# Patient Record
Sex: Male | Born: 1960 | Race: Black or African American | Hispanic: No | Marital: Married | Smoking: Never smoker
Health system: Southern US, Community
[De-identification: ages and names within clinical notes are randomized; demographics above are authoritative.]

## PROBLEM LIST (undated history)

## (undated) DIAGNOSIS — I10 Essential (primary) hypertension: Secondary | ICD-10-CM

## (undated) DIAGNOSIS — I2699 Other pulmonary embolism without acute cor pulmonale: Secondary | ICD-10-CM

## (undated) HISTORY — PX: ACHILLES TENDON SURGERY: SHX542

## (undated) HISTORY — PX: GASTRIC BYPASS: SHX52

---

## 2008-12-19 ENCOUNTER — Emergency Department (HOSPITAL_BASED_OUTPATIENT_CLINIC_OR_DEPARTMENT_OTHER): Admission: EM | Admit: 2008-12-19 | Discharge: 2008-12-20 | Payer: Self-pay | Admitting: Emergency Medicine

## 2011-09-27 ENCOUNTER — Other Ambulatory Visit: Payer: Self-pay

## 2011-09-27 ENCOUNTER — Emergency Department (HOSPITAL_BASED_OUTPATIENT_CLINIC_OR_DEPARTMENT_OTHER)
Admission: EM | Admit: 2011-09-27 | Discharge: 2011-09-27 | Disposition: A | Discharge status: Home / Self Care | Payer: 59 | Source: Home / Self Care | Attending: Emergency Medicine | Admitting: Emergency Medicine

## 2011-09-27 ENCOUNTER — Inpatient Hospital Stay (HOSPITAL_COMMUNITY)
Admission: EM | Admit: 2011-09-27 | Discharge: 2011-09-30 | DRG: 638 | Disposition: A | Discharge status: Home / Self Care | Payer: 59 | Source: Other Acute Inpatient Hospital | Attending: Internal Medicine | Admitting: Internal Medicine

## 2011-09-27 DIAGNOSIS — E860 Dehydration: Secondary | ICD-10-CM

## 2011-09-27 DIAGNOSIS — I10 Essential (primary) hypertension: Secondary | ICD-10-CM | POA: Insufficient documentation

## 2011-09-27 DIAGNOSIS — E1310 Other specified diabetes mellitus with ketoacidosis without coma: Principal | ICD-10-CM | POA: Diagnosis present

## 2011-09-27 DIAGNOSIS — N179 Acute kidney failure, unspecified: Secondary | ICD-10-CM | POA: Diagnosis present

## 2011-09-27 DIAGNOSIS — N289 Disorder of kidney and ureter, unspecified: Secondary | ICD-10-CM | POA: Insufficient documentation

## 2011-09-27 DIAGNOSIS — E876 Hypokalemia: Secondary | ICD-10-CM | POA: Diagnosis present

## 2011-09-27 DIAGNOSIS — E669 Obesity, unspecified: Secondary | ICD-10-CM | POA: Insufficient documentation

## 2011-09-27 DIAGNOSIS — E1110 Type 2 diabetes mellitus with ketoacidosis without coma: Secondary | ICD-10-CM

## 2011-09-27 DIAGNOSIS — R17 Unspecified jaundice: Secondary | ICD-10-CM | POA: Diagnosis present

## 2011-09-27 HISTORY — DX: Essential (primary) hypertension: I10

## 2011-09-27 LAB — BASIC METABOLIC PANEL
BUN: 20 mg/dL (ref 6–23)
CO2: 16 mEq/L — ABNORMAL LOW (ref 19–32)
Calcium: 9.1 mg/dL (ref 8.4–10.5)
Chloride: 102 mEq/L (ref 96–112)
Creatinine, Ser: 1.4 mg/dL — ABNORMAL HIGH (ref 0.50–1.35)
GFR calc Af Amer: 68 mL/min — ABNORMAL LOW (ref >90)
GFR calc non Af Amer: 58 mL/min — ABNORMAL LOW (ref >90)
Glucose, Bld: 308 mg/dL — ABNORMAL HIGH (ref 70–99)
Potassium: 4.1 mEq/L (ref 3.5–5.1)
Sodium: 137 mEq/L (ref 135–145)

## 2011-09-27 LAB — GLUCOSE, CAPILLARY
Glucose-Capillary: 299 mg/dL — ABNORMAL HIGH (ref 70–99)
Glucose-Capillary: 256 mg/dL — ABNORMAL HIGH (ref 70–99)
Glucose-Capillary: 293 mg/dL — ABNORMAL HIGH (ref 70–99)

## 2011-09-27 LAB — COMPREHENSIVE METABOLIC PANEL
Albumin: 4 g/dL (ref 3.5–5.2)
BUN: 15 mg/dL (ref 6–23)
Calcium: 9.1 mg/dL (ref 8.4–10.5)
Creatinine, Ser: 1.2 mg/dL (ref 0.4–1.5)
Total Protein: 7.9 g/dL (ref 6.0–8.3)

## 2011-09-27 LAB — CBC
HCT: 44.8 % (ref 39.0–52.0)
HCT: 42.6 % (ref 39.0–52.0)
Hemoglobin: 14.8 g/dL (ref 13.0–17.0)
MCH: 29.9 pg (ref 26.0–34.0)
MCHC: 33 g/dL (ref 30.0–36.0)
MCHC: 34.7 g/dL (ref 30.0–36.0)
MCV: 90.4 fL (ref 78.0–100.0)
MCV: 86.1 fL (ref 78.0–100.0)
Platelets: 249 10*3/uL (ref 150–400)
Platelets: 169 10*3/uL (ref 150–400)
RBC: 4.95 MIL/uL (ref 4.22–5.81)
RDW: 13.8 % (ref 11.5–15.5)
RDW: 13.3 % (ref 11.5–15.5)
WBC: 7.4 10*3/uL (ref 4.0–10.5)

## 2011-09-27 LAB — URINALYSIS, ROUTINE W REFLEX MICROSCOPIC
Glucose, UA: NEGATIVE mg/dL
Glucose, UA: >1000 mg/dL — AB
Hgb urine dipstick: NEGATIVE
Ketones, ur: >80 mg/dL — AB
Leukocytes, UA: NEGATIVE
Leukocytes, UA: NEGATIVE
Nitrite: NEGATIVE
Nitrite: NEGATIVE
Protein, ur: 30 mg/dL — AB
Protein, ur: 30 mg/dL — AB
Specific Gravity, Urine: 1.027 (ref 1.005–1.030)
Urobilinogen, UA: 1 mg/dL (ref 0.0–1.0)
Urobilinogen, UA: 1 mg/dL (ref 0.0–1.0)
pH: 5.5 (ref 5.0–8.0)

## 2011-09-27 LAB — POCT I-STAT 3, VENOUS BLOOD GAS (G3P V)
Acid-base deficit: 10 mmol/L — ABNORMAL HIGH (ref 0.0–2.0)
Bicarbonate: 16.6 mEq/L — ABNORMAL LOW (ref 20.0–24.0)
O2 Saturation: 49 %
Patient temperature: 97.7
TCO2: 18 mmol/L (ref 0–100)
pCO2, Ven: 35.7 mmHg — ABNORMAL LOW (ref 45.0–50.0)
pH, Ven: 7.272 (ref 7.250–7.300)
pO2, Ven: 29 mmHg — CL (ref 30.0–45.0)

## 2011-09-27 LAB — DIFFERENTIAL
Lymphocytes Relative: 19 % (ref 12–46)
Monocytes Absolute: 0.6 10*3/uL (ref 0.1–1.0)
Monocytes Relative: 5 % (ref 3–12)
Neutro Abs: 9.6 10*3/uL — ABNORMAL HIGH (ref 1.7–7.7)
Neutrophils Relative %: 74 % (ref 43–77)

## 2011-09-27 LAB — URINE MICROSCOPIC-ADD ON

## 2011-09-27 LAB — MAGNESIUM: Magnesium: 2.2 mg/dL (ref 1.5–2.5)

## 2011-09-27 LAB — POCT CARDIAC MARKERS
CKMB, poc: 5.2 ng/mL (ref 1.0–8.0)
Myoglobin, poc: 179 ng/mL (ref 12–200)

## 2011-09-27 MED ORDER — DEXTROSE 50 % IV SOLN: 25.0000 mL | INTRAVENOUS | Status: DC | PRN

## 2011-09-27 MED ORDER — DEXTROSE-NACL 5-0.45 % IV SOLN: INTRAVENOUS | Status: DC

## 2011-09-27 MED ORDER — SODIUM CHLORIDE 0.9 % IV SOLN
INTRAVENOUS | Status: DC
  Administered 2011-09-27: 20:00:00 via INTRAVENOUS
  Filled 2011-09-27: qty 3

## 2011-09-27 MED ORDER — SODIUM CHLORIDE 0.9 % IV BOLUS (SEPSIS)
1000.0000 mL | Freq: Once | INTRAVENOUS | Status: AC
  Administered 2011-09-27: 1000 mL via INTRAVENOUS

## 2011-09-27 MED ORDER — SODIUM CHLORIDE 0.9 % IV SOLN: INTRAVENOUS | Status: DC

## 2011-09-27 NOTE — ED Notes (Signed)
E[place] printed and signed with copy left in chart and sent with pt

## 2011-09-27 NOTE — ED Notes (Signed)
Report called to daniellle Rn [id number] unit

## 2011-09-27 NOTE — ED Notes (Signed)
Elevated BS 324 at home-newly diagnosed DM

## 2011-09-27 NOTE — ED Notes (Signed)
Report given to [facility] 

## 2011-09-27 NOTE — ED Notes (Signed)
[facility] informed BS 298

## 2011-09-28 ENCOUNTER — Inpatient Hospital Stay (HOSPITAL_COMMUNITY): Payer: 59

## 2011-09-28 LAB — COMPREHENSIVE METABOLIC PANEL
BUN: 18 mg/dL (ref 6–23)
CO2: 18 mEq/L — ABNORMAL LOW (ref 19–32)
Calcium: 8.7 mg/dL (ref 8.4–10.5)
Chloride: 109 mEq/L (ref 96–112)
Creatinine, Ser: 1.29 mg/dL (ref 0.50–1.35)
GFR calc Af Amer: 75 mL/min — ABNORMAL LOW (ref >90)
GFR calc non Af Amer: 65 mL/min — ABNORMAL LOW (ref >90)
Total Bilirubin: 0.4 mg/dL (ref 0.3–1.2)

## 2011-09-28 LAB — GLUCOSE, CAPILLARY
Glucose-Capillary: 188 mg/dL — ABNORMAL HIGH (ref 70–99)
Glucose-Capillary: 209 mg/dL — ABNORMAL HIGH (ref 70–99)
Glucose-Capillary: 239 mg/dL — ABNORMAL HIGH (ref 70–99)
Glucose-Capillary: 286 mg/dL — ABNORMAL HIGH (ref 70–99)
Glucose-Capillary: 293 mg/dL — ABNORMAL HIGH (ref 70–99)

## 2011-09-28 LAB — BASIC METABOLIC PANEL
CO2: 20 mEq/L (ref 19–32)
Calcium: 8.7 mg/dL (ref 8.4–10.5)
Creatinine, Ser: 1.33 mg/dL (ref 0.50–1.35)
GFR calc Af Amer: 72 mL/min — ABNORMAL LOW (ref >90)
GFR calc non Af Amer: 62 mL/min — ABNORMAL LOW (ref >90)

## 2011-09-28 LAB — PRO B NATRIURETIC PEPTIDE: Pro B Natriuretic peptide (BNP): 47.8 pg/mL (ref 0–125)

## 2011-09-28 LAB — HEMOGLOBIN A1C
Hgb A1c MFr Bld: 13.5 % — ABNORMAL HIGH (ref <5.7)
Mean Plasma Glucose: 341 mg/dL — ABNORMAL HIGH (ref <117)

## 2011-09-28 NOTE — ED Provider Notes (Signed)
History   49yM with general malaise. Onset about 2w ago. Evaluated by PCP about a week ago and diagnosed with diabetes. Started on oral hypoglycemics. Apparently had recent labs which showed "electrolyte problems" and went to infusion center yesterday for IVF and also given 10u lantus. Has continued to feel very poor. Supposed to start lantus tonight but has not taken dose. Denies pain anywhere. No n/v. No fever or chills. Polyuria, otherwise no urinary complaints.    CSN: [id number] Arrival date & time: 09/27/2011  6:12 PM  Chief Complaint  Patient presents with  . Hyperglycemia    (Consider location/radiation/quality/duration/timing/severity/associated sxs/prior treatment) HPI  Past Medical History  Diagnosis Date  . Diabetes mellitus   . Hypertension     History reviewed. No pertinent past surgical history.  No family history on file.  History  Substance Use Topics  . Smoking status: Never Smoker   . Smokeless tobacco: Not on file  . Alcohol Use: No      Review of Systems  Review of symptoms negative unless otherwise noted in HPI.   Allergies  Review of patient's allergies indicates no known allergies.  Home Medications  No current outpatient prescriptions on file.  BP 156/74  Pulse 76  Temp(Src) 97.9 F (36.6 C) (Oral)  Resp 18  Ht 6\' 3"  (1.905 m)  Wt 404 lb (183.253 kg)  BMI 50.50 kg/m2  SpO2 99%  Physical Exam  Constitutional: He is oriented to person, place, and time. No distress.       obese  HENT:  Head: Normocephalic and atraumatic.  Eyes: Conjunctivae are normal. Pupils are equal, round, and reactive to light.  Neck: Neck supple.  Cardiovascular: Normal rate, regular rhythm and normal heart sounds.   No murmur heard. Pulmonary/Chest: Effort normal. No respiratory distress. He has no wheezes.  Abdominal: Soft. He exhibits no distension. There is no tenderness.  Musculoskeletal: Normal range of motion. He exhibits no edema.  Neurological: He is  alert and oriented to person, place, and time.  Skin: Skin is warm and dry.  Psychiatric: He has a normal mood and affect.    ED Course  Procedures (including critical care time)  Labs Reviewed  GLUCOSE, CAPILLARY - Abnormal; Notable for the following:    Glucose-Capillary 299 (*)    All other components within normal limits  BASIC METABOLIC PANEL - Abnormal; Notable for the following:    CO2 16 (*)    Glucose, Bld 308 (*)    Creatinine, Ser 1.40 (*)    GFR calc non Af Amer 58 (*)    GFR calc Af Amer 68 (*)    All other components within normal limits  URINALYSIS, ROUTINE W REFLEX MICROSCOPIC - Abnormal; Notable for the following:    Glucose, UA >1000 (*)    Bilirubin Urine MODERATE (*)    Ketones, ur >80 (*)    Protein, ur 30 (*)    All other components within normal limits  POCT I-STAT 3, BLOOD GAS (G3P V) - Abnormal; Notable for the following:    pCO2, Ven 35.7 (*)    pO2, Ven 29.0 (*)    Bicarbonate 16.6 (*)    Acid-base deficit 10.0 (*)    All other components within normal limits  GLUCOSE, CAPILLARY - Abnormal; Notable for the following:    Glucose-Capillary 256 (*)    All other components within normal limits  GLUCOSE, CAPILLARY - Abnormal; Notable for the following:    Glucose-Capillary 293 (*)    All other  components within normal limits  CBC  MAGNESIUM  URINE MICROSCOPIC-ADD ON  BLOOD GAS, VENOUS   No results found.   1. Diabetic acidosis   2. Renal insufficiency   3. Dehydration       MDM  49yM with diabetic acidosis. Tired, but not toxic. No historical or clinical evidence of infection. IVF bolus given. Plan continued IVF and insulin gtt. Transfer for admission.        Raeford Razor, MD 09/28/11 0130

## 2011-09-29 LAB — BASIC METABOLIC PANEL
BUN: 15 mg/dL (ref 6–23)
Chloride: 107 mEq/L (ref 96–112)
Creatinine, Ser: 1.16 mg/dL (ref 0.50–1.35)
GFR calc Af Amer: 85 mL/min — ABNORMAL LOW (ref >90)
GFR calc non Af Amer: 73 mL/min — ABNORMAL LOW (ref >90)
Potassium: 3.5 mEq/L (ref 3.5–5.1)

## 2011-09-29 LAB — GLUCOSE, CAPILLARY
Glucose-Capillary: 229 mg/dL — ABNORMAL HIGH (ref 70–99)
Glucose-Capillary: 205 mg/dL — ABNORMAL HIGH (ref 70–99)

## 2011-09-30 LAB — GLUCOSE, CAPILLARY: Glucose-Capillary: 239 mg/dL — ABNORMAL HIGH (ref 70–99)

## 2011-10-03 NOTE — Discharge Summary (Signed)
NAME:  Jeremiah Adams, Jeremiah Adams NO.:  [id number]  MEDICAL RECORD NO.:  [id number]  LOCATION:  3304                         FACILITY:  [facility]  PHYSICIAN:  Hollice Espy, M.D.DATE OF BIRTH:  21-Dec-1961  DATE OF ADMISSION:  09/27/2011 DATE OF DISCHARGE:  09/30/2011                              DISCHARGE SUMMARY   PRIMARY CARE PHYSICIAN:  He goes to [facility] in [place].  DISCHARGE DIAGNOSES: 1. Diabetic ketoacidosis, now resolved secondary to #2. 2. Uncontrolled diabetes mellitus type 2. 3. Morbid obesity. 4. History of hypertension.  Please note, all diagnoses present on admission.  DISCHARGE MEDICATIONS:  For this patient are as follows: 1. NovoLog FlexPen sliding scale t.i.d. with meals plus nightly     coverage. 2. Lantus previously he was on 10 units daily, it is being increased     to 20 units b.i.d. 3. Metformin 1000 p.o. b.i.d. 4. Amaryl 4 mg p.o. daily. 5. Multivitamin p.o. daily. 6. Terazosin 2 mg p.o. at bedtime.  DISCHARGE DIET:  Carb-modified, heart-healthy.  DISPOSITION:  Improved.  ACTIVITY:  Slowly increase.  HOSPITAL COURSE:  The patient is a 50 year old African American male with past medical history of morbid obesity, hypertension who was recently diagnosed with diabetes mellitus about a week before coming into the hospital.  He came in, after being diagnosed he was started on medications, but he had gone to [place] and he said he was eating and drinking heavier than what he normally does.  When came in, he then started feeling very weak, had blurry vision, and came into the emergency room, and found to be MBK with anion gap of 18.  His white blood cell count was normal with no shift.  He was also found to be in some mild acute renal failure.  The patient was started on IV fluids insulin drip.  By hospital day 2, he was able to be weaned off the insulin drip.  His hemoglobin A1c came back at 13.5 with average  CBG of 341.  The patient was then adjusted and regulated and then his home meds were started.  He still elevated blood sugars so his regimen was titrated further increasing the metformin to twice a day, increasing his Lantus to 20 units twice a day as well.  The patient otherwise looked to be doing well.  He was noted to incidentally have some bilirubin in his urine.  We are concerned about the possibility of fatty liver versus volume overload.  BNP was checked and found to be normal and volume overload was ruled out.  A right upper quadrant ultrasound was done because the patient's morbid obesity prevented him from getting a basic CT scan.  Right upper quadrant ultrasound showed no liver abnormalities. His liver function tests were normal, and this was left alone.  It is recommended that his PCP consider an outpatient open CT for evaluation of patient's liver if it is felt to be warranted in the future.  The rest of the patient's medical issues were stable and he is being discharged to home.  His wife has made an appointment with Endocrinology in [place] on Wednesday, October 02, 2011.  He will follow up with  his PCP in the next 1 month.  He also has an outpatient appointment with Ophthalmology for his annual visit in the next few months as well.  In addition, the patient received diabetes and nutrition education while in the hospital and has been referred for outpatient diabetes education as well.     Hollice Espy, M.D.     SKK/MEDQ  D:  09/30/2011  T:  09/30/2011  Job:  [id number]  cc:   [facility]  Electronically Signed by Virginia Rochester M.D. on 10/03/2011 10:36:14 AM

## 2012-07-12 DIAGNOSIS — N342 Other urethritis: Secondary | ICD-10-CM | POA: Insufficient documentation

## 2012-07-12 DIAGNOSIS — R1084 Generalized abdominal pain: Secondary | ICD-10-CM | POA: Insufficient documentation

## 2012-07-12 DIAGNOSIS — A599 Trichomoniasis, unspecified: Secondary | ICD-10-CM | POA: Insufficient documentation

## 2012-07-13 ENCOUNTER — Emergency Department (HOSPITAL_BASED_OUTPATIENT_CLINIC_OR_DEPARTMENT_OTHER)
Admission: EM | Admit: 2012-07-13 | Discharge: 2012-07-13 | Disposition: A | Discharge status: Home / Self Care | Payer: 59 | Attending: Emergency Medicine | Admitting: Emergency Medicine

## 2012-07-13 ENCOUNTER — Other Ambulatory Visit (HOSPITAL_BASED_OUTPATIENT_CLINIC_OR_DEPARTMENT_OTHER): Payer: Self-pay | Admitting: Emergency Medicine

## 2012-07-13 ENCOUNTER — Ambulatory Visit (HOSPITAL_BASED_OUTPATIENT_CLINIC_OR_DEPARTMENT_OTHER)
Admission: RE | Admit: 2012-07-13 | Discharge: 2012-07-13 | Disposition: A | Discharge status: Home / Self Care | Payer: 59 | Source: Ambulatory Visit | Attending: Emergency Medicine | Admitting: Emergency Medicine

## 2012-07-13 DIAGNOSIS — R109 Unspecified abdominal pain: Secondary | ICD-10-CM | POA: Insufficient documentation

## 2012-07-13 DIAGNOSIS — K8020 Calculus of gallbladder without cholecystitis without obstruction: Secondary | ICD-10-CM | POA: Insufficient documentation

## 2012-07-13 LAB — URINALYSIS, ROUTINE W REFLEX MICROSCOPIC
Hgb urine dipstick: NEGATIVE
Nitrite: NEGATIVE
Protein, ur: 30 mg/dL — AB
Urobilinogen, UA: 1 mg/dL (ref 0.0–1.0)

## 2012-07-13 LAB — COMPREHENSIVE METABOLIC PANEL
ALT: 14 U/L (ref 0–53)
AST: 15 U/L (ref 0–37)
Albumin: 3.6 g/dL (ref 3.5–5.2)
CO2: 30 mEq/L (ref 19–32)
Calcium: 9.3 mg/dL (ref 8.4–10.5)
Creatinine, Ser: 1.2 mg/dL (ref 0.50–1.35)
GFR calc non Af Amer: 70 mL/min — ABNORMAL LOW (ref >90)
Sodium: 139 mEq/L (ref 135–145)
Total Protein: 7.8 g/dL (ref 6.0–8.3)

## 2012-07-13 LAB — URINE MICROSCOPIC-ADD ON

## 2012-07-13 LAB — DIFFERENTIAL
Band Neutrophils: 0 % (ref 0–10)
Blasts: 0 %
Eosinophils Absolute: 0 10*3/uL (ref 0.0–0.7)
Eosinophils Relative: 0 % (ref 0–5)
Lymphocytes Relative: 18 % (ref 12–46)
Lymphs Abs: 1.9 10*3/uL (ref 0.7–4.0)
Monocytes Relative: 5 % (ref 3–12)
Neutro Abs: 8 10*3/uL — ABNORMAL HIGH (ref 1.7–7.7)
Promyelocytes Absolute: 0 %

## 2012-07-13 LAB — CBC
MCH: 29.5 pg (ref 26.0–34.0)
MCHC: 34.1 g/dL (ref 30.0–36.0)
MCV: 86.4 fL (ref 78.0–100.0)
Platelets: 260 10*3/uL (ref 150–400)
RDW: 13.4 % (ref 11.5–15.5)

## 2012-07-13 LAB — LIPASE, BLOOD: Lipase: 9 U/L — ABNORMAL LOW (ref 11–59)

## 2012-07-13 MED ORDER — IOHEXOL 300 MG/ML  SOLN
100.0000 mL | Freq: Once | INTRAMUSCULAR | Status: AC | PRN
  Administered 2012-07-12: 100 mL via INTRAVENOUS

## 2012-07-13 MED FILL — Morphine Sulfate Inj 4 MG/ML: INTRAMUSCULAR | Qty: 1 | Status: AC

## 2012-07-13 MED FILL — Ondansetron HCl Inj 4 MG/2ML (2 MG/ML): INTRAMUSCULAR | Qty: 2 | Status: AC

## 2014-12-13 ENCOUNTER — Ambulatory Visit: Payer: Self-pay

## 2015-04-11 ENCOUNTER — Emergency Department (HOSPITAL_BASED_OUTPATIENT_CLINIC_OR_DEPARTMENT_OTHER): Payer: 59

## 2015-04-11 ENCOUNTER — Other Ambulatory Visit: Payer: Self-pay

## 2015-04-11 ENCOUNTER — Emergency Department (HOSPITAL_BASED_OUTPATIENT_CLINIC_OR_DEPARTMENT_OTHER)
Admission: EM | Admit: 2015-04-11 | Discharge: 2015-04-11 | Disposition: A | Discharge status: Home / Self Care | Payer: 59 | Attending: Emergency Medicine | Admitting: Emergency Medicine

## 2015-04-11 ENCOUNTER — Encounter (HOSPITAL_BASED_OUTPATIENT_CLINIC_OR_DEPARTMENT_OTHER): Payer: Self-pay

## 2015-04-11 DIAGNOSIS — Z79899 Other long term (current) drug therapy: Secondary | ICD-10-CM | POA: Insufficient documentation

## 2015-04-11 DIAGNOSIS — I10 Essential (primary) hypertension: Secondary | ICD-10-CM | POA: Diagnosis not present

## 2015-04-11 DIAGNOSIS — Z794 Long term (current) use of insulin: Secondary | ICD-10-CM | POA: Diagnosis not present

## 2015-04-11 DIAGNOSIS — J159 Unspecified bacterial pneumonia: Secondary | ICD-10-CM | POA: Insufficient documentation

## 2015-04-11 DIAGNOSIS — R0602 Shortness of breath: Secondary | ICD-10-CM

## 2015-04-11 DIAGNOSIS — E119 Type 2 diabetes mellitus without complications: Secondary | ICD-10-CM | POA: Insufficient documentation

## 2015-04-11 DIAGNOSIS — Z86711 Personal history of pulmonary embolism: Secondary | ICD-10-CM | POA: Diagnosis not present

## 2015-04-11 DIAGNOSIS — I214 Non-ST elevation (NSTEMI) myocardial infarction: Secondary | ICD-10-CM | POA: Diagnosis not present

## 2015-04-11 DIAGNOSIS — Z7982 Long term (current) use of aspirin: Secondary | ICD-10-CM | POA: Insufficient documentation

## 2015-04-11 DIAGNOSIS — J189 Pneumonia, unspecified organism: Secondary | ICD-10-CM

## 2015-04-11 HISTORY — DX: Other pulmonary embolism without acute cor pulmonale: I26.99

## 2015-04-11 LAB — COMPREHENSIVE METABOLIC PANEL
ALBUMIN: 3.5 g/dL (ref 3.5–5.2)
ALT: 16 U/L (ref 0–53)
AST: 18 U/L (ref 0–37)
Alkaline Phosphatase: 87 U/L (ref 39–117)
Anion gap: 5 (ref 5–15)
BUN: 15 mg/dL (ref 6–23)
CALCIUM: 8.6 mg/dL (ref 8.4–10.5)
CO2: 30 mmol/L (ref 19–32)
Chloride: 104 mmol/L (ref 96–112)
Creatinine, Ser: 1.31 mg/dL (ref 0.50–1.35)
GFR calc Af Amer: 71 mL/min — ABNORMAL LOW (ref >90)
GFR calc non Af Amer: 62 mL/min — ABNORMAL LOW (ref >90)
Glucose, Bld: 173 mg/dL — ABNORMAL HIGH (ref 70–99)
Potassium: 3.5 mmol/L (ref 3.5–5.1)
Sodium: 139 mmol/L (ref 135–145)
Total Bilirubin: 0.5 mg/dL (ref 0.3–1.2)
Total Protein: 7.4 g/dL (ref 6.0–8.3)

## 2015-04-11 LAB — CBC WITH DIFFERENTIAL/PLATELET
Basophils Absolute: 0 10*3/uL (ref 0.0–0.1)
Basophils Relative: 0 % (ref 0–1)
Eosinophils Absolute: 0 10*3/uL (ref 0.0–0.7)
Eosinophils Relative: 0 % (ref 0–5)
HCT: 40.2 % (ref 39.0–52.0)
Hemoglobin: 12.9 g/dL — ABNORMAL LOW (ref 13.0–17.0)
LYMPHS PCT: 24 % (ref 12–46)
Lymphs Abs: 1.8 10*3/uL (ref 0.7–4.0)
MCH: 28.7 pg (ref 26.0–34.0)
MCHC: 32.1 g/dL (ref 30.0–36.0)
MCV: 89.5 fL (ref 78.0–100.0)
MONO ABS: 0.4 10*3/uL (ref 0.1–1.0)
Monocytes Relative: 5 % (ref 3–12)
NEUTROS ABS: 5.3 10*3/uL (ref 1.7–7.7)
NEUTROS PCT: 71 % (ref 43–77)
PLATELETS: 225 10*3/uL (ref 150–400)
RBC: 4.49 MIL/uL (ref 4.22–5.81)
RDW: 14.8 % (ref 11.5–15.5)
WBC: 7.5 10*3/uL (ref 4.0–10.5)

## 2015-04-11 LAB — CBG MONITORING, ED: Glucose-Capillary: 71 mg/dL (ref 70–99)

## 2015-04-11 LAB — TROPONIN I
Troponin I: 0.09 ng/mL — ABNORMAL HIGH (ref <0.031)
Troponin I: 0.09 ng/mL — ABNORMAL HIGH (ref <0.031)

## 2015-04-11 MED ORDER — SODIUM CHLORIDE 0.9 % IV BOLUS (SEPSIS)
500.0000 mL | Freq: Once | INTRAVENOUS | Status: AC
  Administered 2015-04-11: 500 mL via INTRAVENOUS

## 2015-04-11 MED ORDER — ASPIRIN 325 MG PO TABS
325.0000 mg | ORAL_TABLET | ORAL | Status: AC
  Administered 2015-04-11: 325 mg via ORAL
  Filled 2015-04-11: qty 1

## 2015-04-11 MED ORDER — CEFTRIAXONE SODIUM 1 G IJ SOLR
INTRAMUSCULAR | Status: AC
  Administered 2015-04-11: 1000 mg
  Filled 2015-04-11: qty 10

## 2015-04-11 MED ORDER — HEPARIN BOLUS VIA INFUSION
4000.0000 [IU] | Freq: Once | INTRAVENOUS | Status: AC
  Administered 2015-04-11: 4000 [IU] via INTRAVENOUS

## 2015-04-11 MED ORDER — DEXTROSE 5 % IV SOLN
500.0000 mg | Freq: Once | INTRAVENOUS | Status: AC
  Administered 2015-04-11: 500 mg via INTRAVENOUS
  Filled 2015-04-11: qty 500

## 2015-04-11 MED ORDER — IOHEXOL 350 MG/ML SOLN
100.0000 mL | Freq: Once | INTRAVENOUS | Status: AC | PRN
  Administered 2015-04-11: 100 mL via INTRAVENOUS

## 2015-04-11 MED ORDER — HEPARIN (PORCINE) IN NACL 100-0.45 UNIT/ML-% IJ SOLN
1900.0000 [IU]/h | INTRAMUSCULAR | Status: DC
  Administered 2015-04-11: 1900 [IU]/h via INTRAVENOUS
  Filled 2015-04-11: qty 250

## 2015-04-11 MED ORDER — DEXTROSE 5 % IV SOLN: 1.0000 g | Freq: Once | INTRAVENOUS | Status: DC

## 2015-04-11 NOTE — Progress Notes (Signed)
ANTICOAGULATION CONSULT NOTE - Initial Consult  Pharmacy Consult:  Heparin Indication:  NSTEMI  No Known Allergies  Patient Measurements: Height: 6\' 4"  (193 cm) Weight: (!) 461 lb (209.108 kg) IBW/kg (Calculated) : 86.8 Heparin Dosing Weight: 139 kg  Vital Signs: Temp: 98 F (36.7 C) (04/19 0824) Temp Source: Oral (04/19 0824) BP: 195/93 mmHg (04/19 0914) Pulse Rate: 82 (04/19 0914)  Labs:  Recent Labs  04/11/15 0855  HGB 12.9*  HCT 40.2  PLT 225  CREATININE 1.31  TROPONINI 0.09*    Estimated Creatinine Clearance: 128 mL/min (by C-G formula based on Cr of 1.31).   Medical History: Past Medical History  Diagnosis Date  . Diabetes mellitus   . Hypertension   . Pulmonary embolism        Assessment: [age] YOM presented with SOB and CTA was negative for PE.  Patient's troponin is positive and Pharmacy consulted to initiate IV heparin for NSTEMI.  Baseline labs reviewed.   Goal of Therapy:  Heparin level 0.3-0.7 units/ml Monitor platelets by anticoagulation protocol: Yes    Plan:  - Heparin 4000 units IV bolus x 1, then - Heparin gtt at 1900 units/hr - Check 6 hr HL - Daily HL / CBC    Dillin Lofgren D. Laney Potashang, PharmD, BCPS Pager:  [phone] 04/11/2015, 10:48 AM

## 2015-04-11 NOTE — ED Notes (Signed)
SHOB on exertion and states "I just feel winded". Denies chest pain or recent illness.

## 2015-04-11 NOTE — ED Notes (Signed)
Troponin level drawn at 1600 [facility] [place]

## 2015-04-11 NOTE — ED Provider Notes (Signed)
CSN: [id number]     Arrival date & time 04/11/15  4098 History   First MD Initiated Contact with Patient 04/11/15 0827     Chief Complaint  Patient presents with  . Shortness of Breath     (Consider location/radiation/quality/duration/timing/severity/associated sxs/prior Treatment) Patient is a 54 y.o. male presenting with shortness of breath. The history is provided by the patient.  Shortness of Breath Severity:  Mild Onset quality:  Gradual Duration: 3-4. Timing:  Constant Progression:  Unchanged Chronicity:  New Context: activity   Relieved by:  Rest Worsened by:  Activity Ineffective treatments:  None tried Associated symptoms: no abdominal pain, no chest pain, no cough, no fever, no headaches, no neck pain and no vomiting     Past Medical History  Diagnosis Date  . Diabetes mellitus   . Hypertension   . Pulmonary embolism    Past Surgical History  Procedure Laterality Date  . Achilles tendon surgery     No family history on file. History  Substance Use Topics  . Smoking status: Never Smoker   . Smokeless tobacco: Not on file  . Alcohol Use: No    Review of Systems  Constitutional: Negative for fever.  HENT: Negative for drooling and rhinorrhea.   Eyes: Negative for pain.  Respiratory: Positive for shortness of breath. Negative for cough.   Cardiovascular: Negative for chest pain and leg swelling.  Gastrointestinal: Negative for nausea, vomiting, abdominal pain and diarrhea.  Genitourinary: Negative for dysuria and hematuria.  Musculoskeletal: Negative for gait problem and neck pain.  Skin: Negative for color change.  Neurological: Negative for numbness and headaches.  Hematological: Negative for adenopathy.  Psychiatric/Behavioral: Negative for behavioral problems.  All other systems reviewed and are negative.     Allergies  Review of patient's allergies indicates no known allergies.  Home Medications   Prior to Admission medications    Medication Sig Start Date End Date Taking? Authorizing Provider  amLODipine (NORVASC) 10 MG tablet Take 10 mg by mouth daily.   Yes Historical Provider, MD  aspirin 81 MG tablet Take 81 mg by mouth daily.   Yes Historical Provider, MD  atorvastatin (LIPITOR) 20 MG tablet Take 20 mg by mouth daily.   Yes Historical Provider, MD  metFORMIN (GLUCOPHAGE) 850 MG tablet Take 850 mg by mouth 2 (two) times daily with a meal.   Yes Historical Provider, MD  metoprolol succinate (TOPROL-XL) 25 MG 24 hr tablet Take 25 mg by mouth daily.   Yes Historical Provider, MD  pioglitazone (ACTOS) 15 MG tablet Take 15 mg by mouth daily.   Yes Historical Provider, MD  Testosterone 20.25 MG/1.25GM (1.62%) GEL Place onto the skin.   Yes Historical Provider, MD  insulin glargine (LANTUS) 100 UNIT/ML injection Inject 10 Units into the skin at bedtime.      Historical Provider, MD  metFORMIN (GLUCOPHAGE-XR) 500 MG 24 hr tablet Take 500 mg by mouth 2 (two) times daily.      Historical Provider, MD   BP 230/107 mmHg  Pulse 88  Temp(Src) 98 F (36.7 C) (Oral)  Ht  (1.93 m)  Wt 461 lb (209.108 kg)  BMI 56.14 kg/m2  SpO2 94% Physical Exam  Constitutional: He is oriented to person, place, and time. He appears well-developed and well-nourished.  HENT:  Head: Normocephalic and atraumatic.  Right Ear: External ear normal.  Left Ear: External ear normal.  Nose: Nose normal.  Mouth/Throat: Oropharynx is clear and moist. No oropharyngeal exudate.  Eyes: Conjunctivae and  EOM are normal. Pupils are equal, round, and reactive to light.  Neck: Normal range of motion. Neck supple.  Cardiovascular: Normal rate, regular rhythm, normal heart sounds and intact distal pulses.  Exam reveals no gallop and no friction rub.   No murmur heard. Pulmonary/Chest: Effort normal and breath sounds normal. No respiratory distress. He has no wheezes.  Abdominal: Soft. Bowel sounds are normal. He exhibits no distension. There is no  tenderness. There is no rebound and no guarding.  Musculoskeletal: Normal range of motion. He exhibits no edema or tenderness.  Symmetric lower extremities without focal tenderness.  Neurological: He is alert and oriented to person, place, and time.  Skin: Skin is warm. He is diaphoretic (mild).  Psychiatric: He has a normal mood and affect. His behavior is normal.  Nursing note and vitals reviewed.   ED Course  Procedures (including critical care time) Labs Review Labs Reviewed  CBC WITH DIFFERENTIAL/PLATELET - Abnormal; Notable for the following:    Hemoglobin 12.9 (*)    All other components within normal limits  COMPREHENSIVE METABOLIC PANEL - Abnormal; Notable for the following:    Glucose, Bld 173 (*)    GFR calc non Af Amer 62 (*)    GFR calc Af Amer 71 (*)    All other components within normal limits  TROPONIN I - Abnormal; Notable for the following:    Troponin I 0.09 (*)    All other components within normal limits    Imaging Review Ct Angio Chest Pe W/cm &/or Wo Cm  04/11/2015   CLINICAL DATA:  Short of breath. History of pulmonary embolism 3 years ago.  EXAM: CT ANGIOGRAPHY CHEST WITH CONTRAST  TECHNIQUE: Multidetector CT imaging of the chest was performed using the standard protocol during bolus administration of intravenous contrast. Multiplanar CT image reconstructions and MIPs were obtained to evaluate the vascular anatomy.  CONTRAST:  100mL OMNIPAQUE IOHEXOL 350 MG/ML SOLN  COMPARISON:  none  FINDINGS: Negative for pulmonary embolism. Negative for aortic dissection or aneurysm. Heart size is upper normal. No pericardial effusion.  Patchy right upper lobe infiltrate, possible pneumonia. Remainder of the lungs are clear. No effusion  Negative for mass or adenopathy.  No acute skeletal lesions.  Review of the MIP images confirms the above findings.  IMPRESSION: Negative for pulmonary embolism.  Small right upper lobe infiltrate, possible pneumonia.   Electronically Signed    By: Marlan Palauharles  Clark M.D.   On: 04/11/2015 09:51     EKG Interpretation   Date/Time:  Tuesday April 11 2015 08:48:20 EDT Ventricular Rate:  85 PR Interval:  206 QRS Duration: 118 QT Interval:  422 QTC Calculation: 502 R Axis:   -77 Text Interpretation:  Normal sinus rhythm Possible Left atrial enlargement  Left anterior fascicular block Nonspecific ST and T wave abnormality  Prolonged QT Otherwise no significant change Confirmed by Abagale Boulos  MD,  Minda Faas ([id number]) on 04/11/2015 8:50:04 AM     CRITICAL CARE Performed by: Purvis SheffieldHARRISON, Evellyn Tuff, S Total critical care time: 30 min Critical care time was exclusive of separately billable procedures and treating other patients. Critical care was necessary to treat or prevent imminent or life-threatening deterioration. Critical care was time spent personally by me on the following activities: development of treatment plan with patient and/or surrogate as well as nursing, discussions with consultants, evaluation of patient's response to treatment, examination of patient, obtaining history from patient or surrogate, ordering and performing treatments and interventions, ordering and review of laboratory studies, ordering and review  of radiographic studies, pulse oximetry and re-evaluation of patient's condition.  MDM   Final diagnoses:  SOB (shortness of breath)  NSTEMI (non-ST elevated myocardial infarction)  CAP (community acquired pneumonia)    8:43 AM 54 y.o. male w hx of PE approx 3 years ago after achilles surg and road trip, DM on metformin, HTN who presents with shortness of breath with exertion which he first noted 3-4 days ago. He denies any pain. He states that he is asymptomatic at rest. He had a similar presentation when he had a PE several years ago. He is afebrile and hypertensive here. He has moderate risk per Wells criteria. We discussed d-dimer versus CT imaging. He states that he would feel more comfortable with CT imaging and I think  this is reasonable as well.  Found to have elevated trop. Will heparinize. Pt continues to deny cp.   10:43 AM Discussed case w/ Dr. Dot Been (cards). Will admit to hospitalist, Dr. Linard Millers. Will give asa here and heparinize. We will also cover with Rocephin and Zithromax due to the possible right upper lobe pneumonia noted on CT scan.  The patient will be admitted to [facility]. Any medications given in the ED during this visit are listed below:  Medications  cefTRIAXone (ROCEPHIN) 1 g in dextrose 5 % 50 mL IVPB (0 g Intravenous Stopped 04/11/15 1343)  heparin ADULT infusion 100 units/mL (25000 units/250 mL) (1,900 Units/hr Intravenous New Bag/Given 04/11/15 1038)  sodium chloride 0.9 % bolus 500 mL (0 mLs Intravenous Stopped 04/11/15 1028)  iohexol (OMNIPAQUE) 350 MG/ML injection 100 mL (100 mLs Intravenous Contrast Given 04/11/15 0935)  aspirin tablet 325 mg (325 mg Oral Given 04/11/15 1034)  azithromycin (ZITHROMAX) 500 mg in dextrose 5 % 250 mL IVPB (0 mg Intravenous Stopped 04/11/15 1343)  cefTRIAXone (ROCEPHIN) 1 G injection (1,000 mg  Given 04/11/15 1036)  heparin bolus via infusion 4,000 Units (4,000 Units Intravenous Given 04/11/15 1038)      Purvis Sheffield, MD 04/11/15 1526

## 2015-04-11 NOTE — ED Notes (Signed)
Patient transported to CT 

## 2015-04-11 NOTE — ED Notes (Signed)
Patient pulled out right hand IV,  Reinserted in right hand with one attempt, saline lock, # 20 G .

## 2016-12-15 ENCOUNTER — Emergency Department (HOSPITAL_BASED_OUTPATIENT_CLINIC_OR_DEPARTMENT_OTHER): Payer: 59

## 2016-12-15 ENCOUNTER — Encounter (HOSPITAL_BASED_OUTPATIENT_CLINIC_OR_DEPARTMENT_OTHER): Payer: Self-pay | Admitting: *Deleted

## 2016-12-15 ENCOUNTER — Emergency Department (HOSPITAL_BASED_OUTPATIENT_CLINIC_OR_DEPARTMENT_OTHER)
Admission: EM | Admit: 2016-12-15 | Discharge: 2016-12-15 | Disposition: A | Discharge status: Home / Self Care | Payer: 59 | Attending: Emergency Medicine | Admitting: Emergency Medicine

## 2016-12-15 DIAGNOSIS — Z7982 Long term (current) use of aspirin: Secondary | ICD-10-CM | POA: Insufficient documentation

## 2016-12-15 DIAGNOSIS — K8050 Calculus of bile duct without cholangitis or cholecystitis without obstruction: Secondary | ICD-10-CM

## 2016-12-15 DIAGNOSIS — K8020 Calculus of gallbladder without cholecystitis without obstruction: Secondary | ICD-10-CM

## 2016-12-15 DIAGNOSIS — Z79899 Other long term (current) drug therapy: Secondary | ICD-10-CM | POA: Diagnosis not present

## 2016-12-15 DIAGNOSIS — I10 Essential (primary) hypertension: Secondary | ICD-10-CM | POA: Insufficient documentation

## 2016-12-15 DIAGNOSIS — K8070 Calculus of gallbladder and bile duct without cholecystitis without obstruction: Secondary | ICD-10-CM | POA: Diagnosis not present

## 2016-12-15 DIAGNOSIS — Z794 Long term (current) use of insulin: Secondary | ICD-10-CM | POA: Diagnosis not present

## 2016-12-15 DIAGNOSIS — R1010 Upper abdominal pain, unspecified: Secondary | ICD-10-CM

## 2016-12-15 DIAGNOSIS — E119 Type 2 diabetes mellitus without complications: Secondary | ICD-10-CM | POA: Insufficient documentation

## 2016-12-15 LAB — CBC WITH DIFFERENTIAL/PLATELET
Basophils Absolute: 0 10*3/uL (ref 0.0–0.1)
Basophils Relative: 0 %
Eosinophils Absolute: 0 10*3/uL (ref 0.0–0.7)
Eosinophils Relative: 0 %
HCT: 41.2 % (ref 39.0–52.0)
Hemoglobin: 14.2 g/dL (ref 13.0–17.0)
Lymphocytes Relative: 12 %
Lymphs Abs: 1.2 10*3/uL (ref 0.7–4.0)
MCH: 31.3 pg (ref 26.0–34.0)
MCHC: 34.5 g/dL (ref 30.0–36.0)
MCV: 90.7 fL (ref 78.0–100.0)
Monocytes Absolute: 0.4 10*3/uL (ref 0.1–1.0)
Monocytes Relative: 4 %
Neutro Abs: 8.1 10*3/uL — ABNORMAL HIGH (ref 1.7–7.7)
Neutrophils Relative %: 84 %
Platelets: 237 10*3/uL (ref 150–400)
RBC: 4.54 MIL/uL (ref 4.22–5.81)
RDW: 12.4 % (ref 11.5–15.5)
WBC: 9.7 10*3/uL (ref 4.0–10.5)

## 2016-12-15 LAB — COMPREHENSIVE METABOLIC PANEL
ALT: 75 U/L — ABNORMAL HIGH (ref 17–63)
AST: 131 U/L — ABNORMAL HIGH (ref 15–41)
Albumin: 4 g/dL (ref 3.5–5.0)
Alkaline Phosphatase: 108 U/L (ref 38–126)
Anion gap: 8 (ref 5–15)
BUN: 24 mg/dL — ABNORMAL HIGH (ref 6–20)
CO2: 28 mmol/L (ref 22–32)
Calcium: 9.1 mg/dL (ref 8.9–10.3)
Chloride: 103 mmol/L (ref 101–111)
Creatinine, Ser: 1.85 mg/dL — ABNORMAL HIGH (ref 0.61–1.24)
GFR calc Af Amer: 46 mL/min — ABNORMAL LOW (ref >60)
GFR calc non Af Amer: 40 mL/min — ABNORMAL LOW (ref >60)
Glucose, Bld: 149 mg/dL — ABNORMAL HIGH (ref 65–99)
Potassium: 3.3 mmol/L — ABNORMAL LOW (ref 3.5–5.1)
Sodium: 139 mmol/L (ref 135–145)
Total Bilirubin: 1.6 mg/dL — ABNORMAL HIGH (ref 0.3–1.2)
Total Protein: 7.8 g/dL (ref 6.5–8.1)

## 2016-12-15 LAB — LIPASE, BLOOD: Lipase: 12 U/L (ref 11–51)

## 2016-12-15 MED ORDER — ONDANSETRON HCL 4 MG/2ML IJ SOLN
4.0000 mg | Freq: Once | INTRAMUSCULAR | Status: AC
Start: 2016-12-15 — End: 2016-12-15
  Administered 2016-12-15: 4 mg via INTRAVENOUS
  Filled 2016-12-15: qty 2

## 2016-12-15 MED ORDER — PROMETHAZINE HCL 25 MG PO TABS: 25.0000 mg | ORAL_TABLET | Freq: Three times a day (TID) | ORAL | 0 refills | Status: AC | PRN

## 2016-12-15 MED ORDER — TRAMADOL HCL 50 MG PO TABS: 50.0000 mg | ORAL_TABLET | Freq: Four times a day (QID) | ORAL | 0 refills | Status: AC | PRN

## 2016-12-15 MED ORDER — MORPHINE SULFATE (PF) 4 MG/ML IV SOLN
4.0000 mg | Freq: Once | INTRAVENOUS | Status: AC
Start: 2016-12-15 — End: 2016-12-15
  Administered 2016-12-15: 4 mg via INTRAVENOUS
  Filled 2016-12-15: qty 1

## 2016-12-15 MED ORDER — SODIUM CHLORIDE 0.9 % IV BOLUS (SEPSIS)
1000.0000 mL | Freq: Once | INTRAVENOUS | Status: AC
  Administered 2016-12-15: 1000 mL via INTRAVENOUS

## 2016-12-15 NOTE — ED Notes (Signed)
Pt vomited x1.  

## 2016-12-15 NOTE — ED Notes (Signed)
ED Provider at bedside. 

## 2016-12-15 NOTE — ED Triage Notes (Signed)
Abdominal cramps x 3 hours since eating chicken.

## 2016-12-15 NOTE — Discharge Instructions (Signed)
Follow up with her surgeon and her gallbladder does have stones and may need to come out.  Your liver enzymes are slightly elevated, which could be related to her gallbladder.  Avoid very greasy foods and high fat foods

## 2016-12-15 NOTE — ED Provider Notes (Signed)
[facility] Provider Note   CSN: [id number] Arrival date & time: 12/15/16  1327     History   Chief Complaint Chief Complaint  Patient presents with  . Abdominal Pain    HPI Jeremiah Adams is a 55 y.o. male.  HPI Patient presents to the emergency department with upper abdominal pain that started shortly after eating fried chicken.  The patient states that he started having severe mid upper abdominal cramping type pain.  He states no nausea or vomiting.  States that he did not take any medications prior to arrival.  He states nothing seems make the condition worse.  He states that walking and deep breathing makes the pain feel some better. The patient denies chest pain, shortness of breath, headache,blurred vision, neck pain, fever, cough, weakness, numbness, dizziness, anorexia, edema,  nausea, vomiting, diarrhea, rash, back pain, dysuria, hematemesis, bloody stool, near syncope, or syncope. Past Medical History:  Diagnosis Date  . Diabetes mellitus   . Hypertension   . Pulmonary embolism (HCC)     There are no active problems to display for this patient.   Past Surgical History:  Procedure Laterality Date  . ACHILLES TENDON SURGERY    . GASTRIC BYPASS         Home Medications    Prior to Admission medications   Medication Sig Start Date End Date Taking? Authorizing Provider  amLODipine (NORVASC) 10 MG tablet Take 10 mg by mouth daily.   Yes Historical Provider, MD  aspirin 81 MG tablet Take 81 mg by mouth daily.   Yes Historical Provider, MD  LISINOPRIL PO Take by mouth.   Yes Historical Provider, MD  metoprolol succinate (TOPROL-XL) 25 MG 24 hr tablet Take 25 mg by mouth daily.   Yes Historical Provider, MD  atorvastatin (LIPITOR) 20 MG tablet Take 20 mg by mouth daily.    Historical Provider, MD  insulin glargine (LANTUS) 100 UNIT/ML injection Inject 10 Units into the skin at bedtime.      Historical Provider, MD  metFORMIN (GLUCOPHAGE) 850 MG  tablet Take 850 mg by mouth 2 (two) times daily with a meal.    Historical Provider, MD  metFORMIN (GLUCOPHAGE-XR) 500 MG 24 hr tablet Take 500 mg by mouth 2 (two) times daily.      Historical Provider, MD  pioglitazone (ACTOS) 15 MG tablet Take 15 mg by mouth daily.    Historical Provider, MD  Testosterone 20.25 MG/1.25GM (1.62%) GEL Place onto the skin.    Historical Provider, MD    Family History No family history on file.  Social History Social History  Substance Use Topics  . Smoking status: Never Smoker  . Smokeless tobacco: Never Used  . Alcohol use No     Allergies   Patient has no known allergies.   Review of Systems Review of Systems All other systems negative except as documented in the HPI. All pertinent positives and negatives as reviewed in the HPI.Physical Exam Updated Vital Signs BP 176/82   Pulse 62   Temp 97.5 F (36.4 C) (Oral)   Resp 14   Ht 6\' 4"  (1.93 m)   Wt (!) 161.5 kg   SpO2 100%   BMI 43.33 kg/m   Physical Exam  Constitutional: He is oriented to person, place, and time. He appears well-developed and well-nourished. No distress.  HENT:  Head: Normocephalic and atraumatic.  Mouth/Throat: Oropharynx is clear and moist.  Eyes: Pupils are equal, round, and reactive to light.  Neck: Normal range  of motion. Neck supple.  Cardiovascular: Normal rate, regular rhythm and normal heart sounds.  Exam reveals no gallop and no friction rub.   No murmur heard. Pulmonary/Chest: Effort normal and breath sounds normal. No respiratory distress. He has no wheezes.  Abdominal: Soft. Normal appearance and bowel sounds are normal. He exhibits no distension and no mass. There is tenderness in the epigastric area. There is no guarding.    Neurological: He is alert and oriented to person, place, and time. He exhibits normal muscle tone. Coordination normal.  Skin: Skin is warm and dry. No rash noted. No erythema.  Psychiatric: He has a normal mood and affect. His  behavior is normal.  Nursing note and vitals reviewed.    ED Treatments / Results  Labs (all labs ordered are listed, but only abnormal results are displayed) Labs Reviewed  COMPREHENSIVE METABOLIC PANEL - Abnormal; Notable for the following:       Result Value   Potassium 3.3 (*)    Glucose, Bld 149 (*)    BUN 24 (*)    Creatinine, Ser 1.85 (*)    AST 131 (*)    ALT 75 (*)    Total Bilirubin 1.6 (*)    GFR calc non Af Amer 40 (*)    GFR calc Af Amer 46 (*)    All other components within normal limits  CBC WITH DIFFERENTIAL/PLATELET - Abnormal; Notable for the following:    Neutro Abs 8.1 (*)    All other components within normal limits  LIPASE, BLOOD    EKG  EKG Interpretation None       Radiology Ct Renal Stone Study  Result Date: 12/15/2016 CLINICAL DATA:  Epigastric pain for 1 day. EXAM: CT ABDOMEN AND PELVIS WITHOUT CONTRAST TECHNIQUE: Multidetector CT imaging of the abdomen and pelvis was performed following the standard protocol without IV contrast. COMPARISON:  07/12/2012 FINDINGS: Lower chest: Mild cardiomegaly. Hepatobiliary: Mildly distended gallbladder, diameter 5 cm, with dependent density and also some inferior wall density including a 1.9 cm hyperdensity inferiorly in the gallbladder, these are likely a manifestation of sludge and cholelithiasis. Pancreas: Unremarkable Spleen: Unremarkable Adrenals/Urinary Tract: Left adrenal fullness with faint adjacent stranding. No overt mass or hematoma. Right adrenal gland unremarkable. The mild scarring in the right kidney upper pole. No nephrolithiasis identified. Stomach/Bowel: Prior gastric surgery, possibly a gastric sleeve. Appendix normal. No dilated bowel. Several diverticular present at the junction of the descending and sigmoid colon. Vascular/Lymphatic: Mild abdominal aortic atherosclerotic calcification. Reproductive: Enlarged prostate gland, approximately 5.8 by 5.3 by 4.8 cm (volume = 77 cm^3), with  calcifications in the central zone. Other: No supplemental non-categorized findings. 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 Facet and intervertebral spurring leads to mild foraminal stenosis at several levels most notably L3-4, L4-5, and L5-S1. IMPRESSION: 1. Mildly distended gallbladder with suspected sludge and gallstones. No definite pericholecystic fluid is identified. Correlate clinically in assessing whether further workup for cholecystitis is warranted. 2. Postoperative findings along the stomach, appearance favors gastric sleeve. 3. There is some subtle fullness of the left adrenal gland. Some of this was present previously. Questionable stranding around the left adrenal gland but no overt hematoma or mass. 4. Enlarged prostate gland, 77 cubic cm. 5. Mild foraminal stenosis at L3-4, L4-5, and L5-S1 due to spondylosis. 6. Mild cardiomegaly. Electronically Signed   By: Gaylyn Rong M.D.   On: 12/15/2016 14:51    Procedures Procedures (including critical care time)  Medications Ordered in ED Medications  sodium chloride 0.9 % bolus 1,000 mL (0 mLs Intravenous Stopped 12/15/16 1517)  morphine 4 MG/ML injection 4 mg (4 mg Intravenous Given 12/15/16 1420)  ondansetron (ZOFRAN) injection 4 mg (4 mg Intravenous Given 12/15/16 1423)     Initial Impression / Assessment and Plan / ED Course  I have reviewed the triage vital signs and the nursing notes.  Pertinent labs & imaging results that were available during my care of the patient were reviewed by me and considered in my medical decision making (see chart for details).  Clinical Course     Patient's CT scan did not show any abnormality other than some gallbladder issues.  The patient was rechecked and has 0 pain at this time is feeling complete relief of his symptoms.  We will get an ultrasound for further evaluation, but otherwise, the patient most likely will follow-up with his general surgeon.  Told  to return here as needed.  Advised patient this could be related to his gallbladder and that if anything worsens with his condition, to return here  Final Clinical Impressions(s) / ED Diagnoses   Final diagnoses:  Upper abdominal pain    New Prescriptions New Prescriptions   No medications on file     Charlestine NightChristopher Calais Svehla, PA-C 12/15/16 1549    [address] Dunbar StreetChristopher Christinea Brizuela, PA-C 12/15/16 1626    Arby BarretteMarcy Pfeiffer, MD 12/20/16 [phone]

## 2017-03-07 IMAGING — US US ABDOMEN LIMITED
1 series · 14 of 25 positions shown · non-contrast
Comparison: CT from earlier in the same day

CLINICAL DATA: Epigastric pain for several hours

EXAM:
US ABDOMEN LIMITED - RIGHT UPPER QUADRANT

[Series 1: us abdomen limited · 0.27mm/px · 14 of 55 slices shown]
[im 1/55]
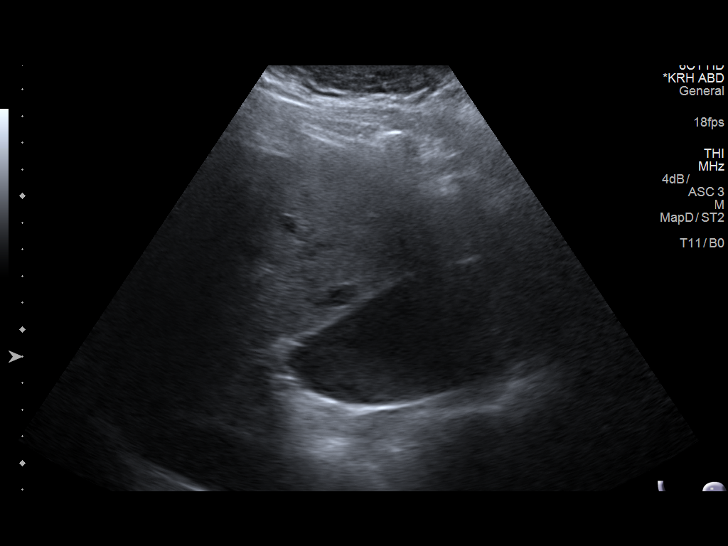
[im 5/55]
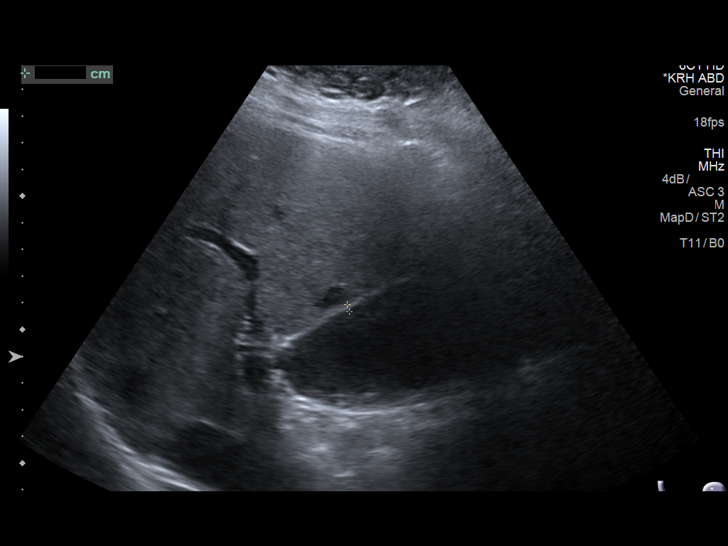
[im 10/55]
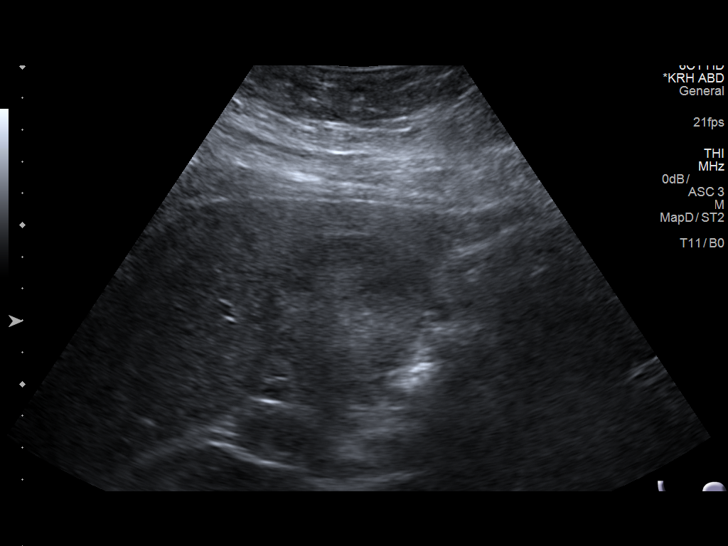
[im 14/55]
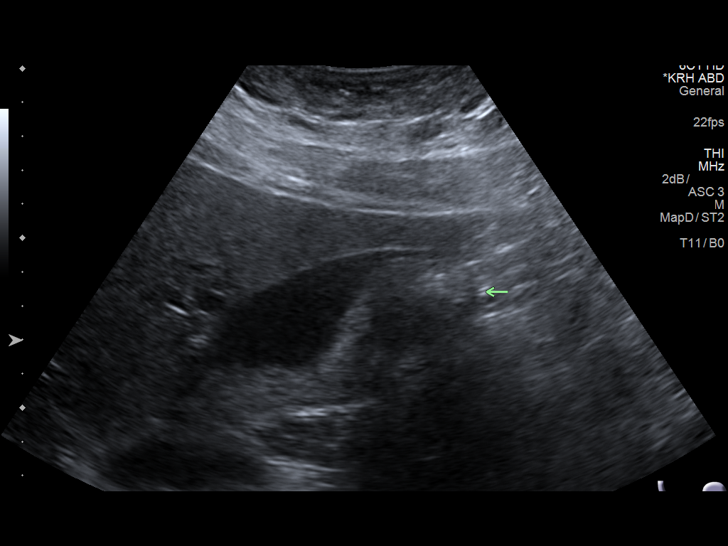
[im 19/55]
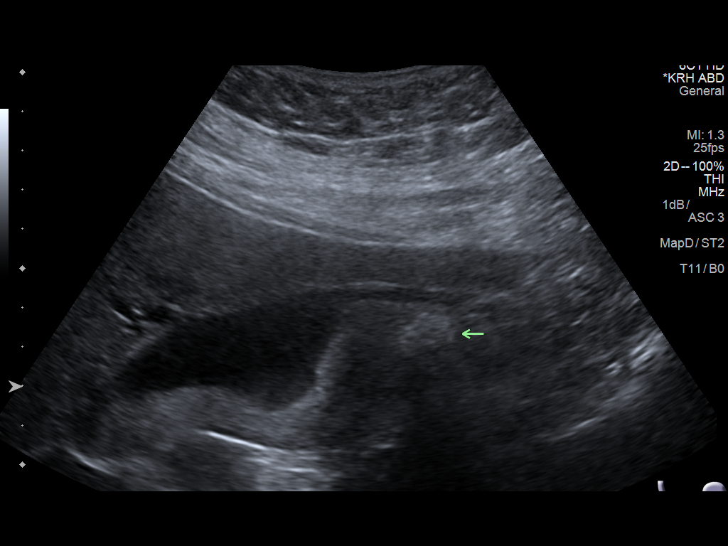
[im 21/55]
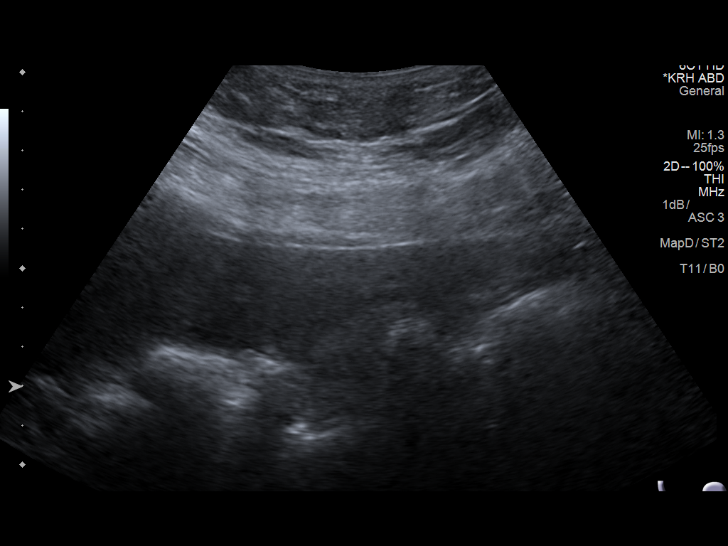
[im 25/55]
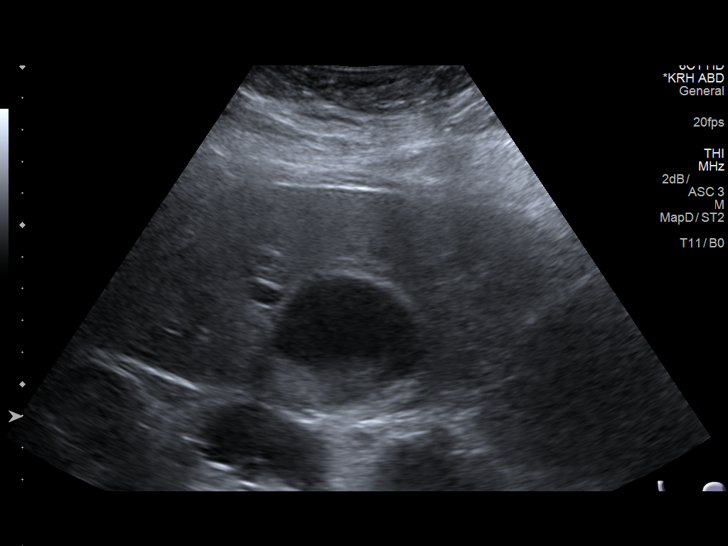
[im 30/55]
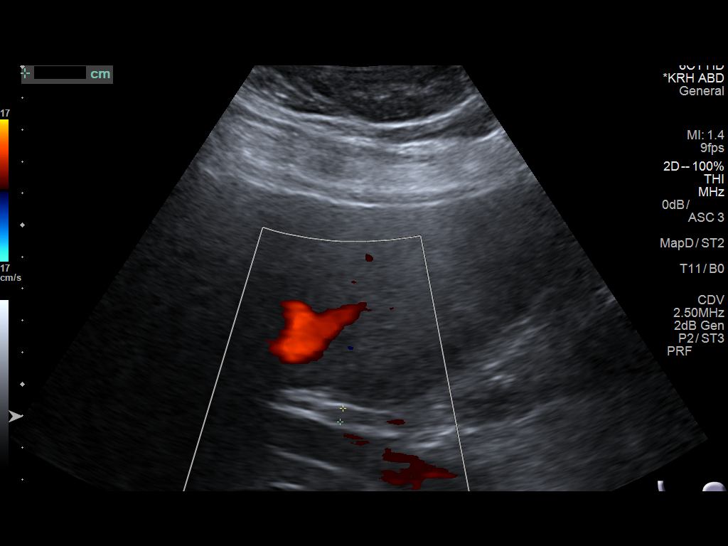
[im 34/55]
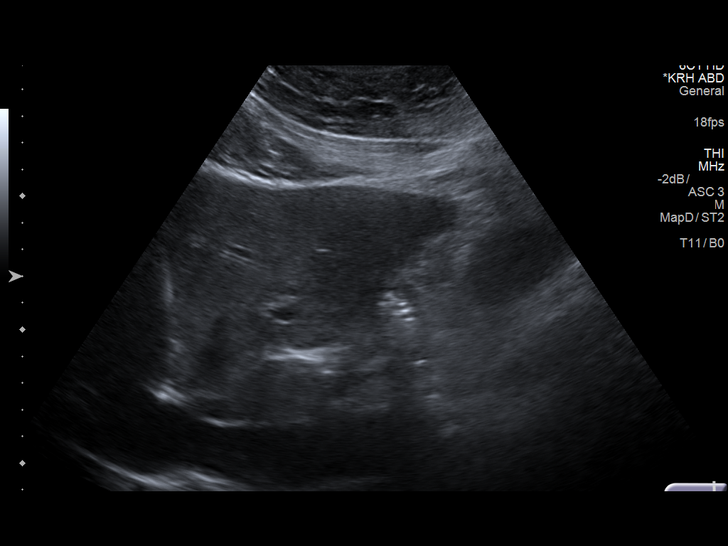
[im 37/55]
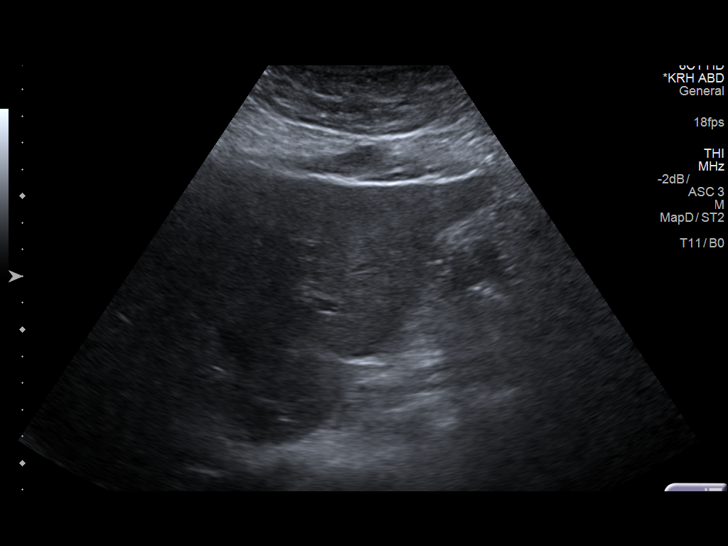
[im 41/55]
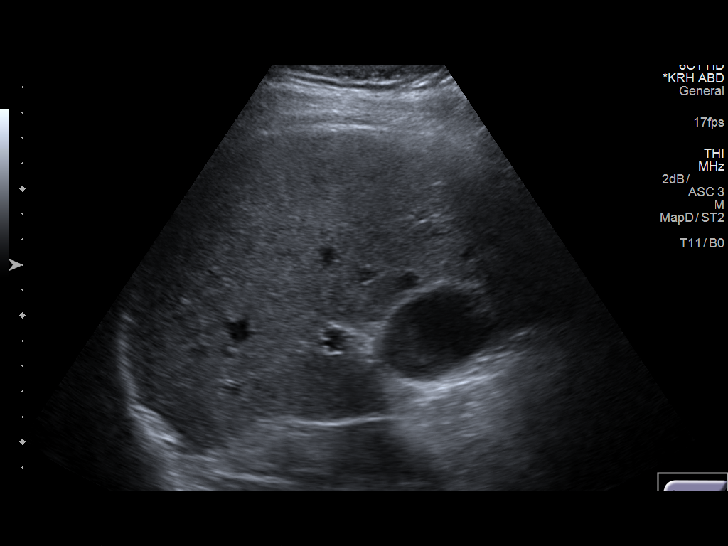
[im 46/55]
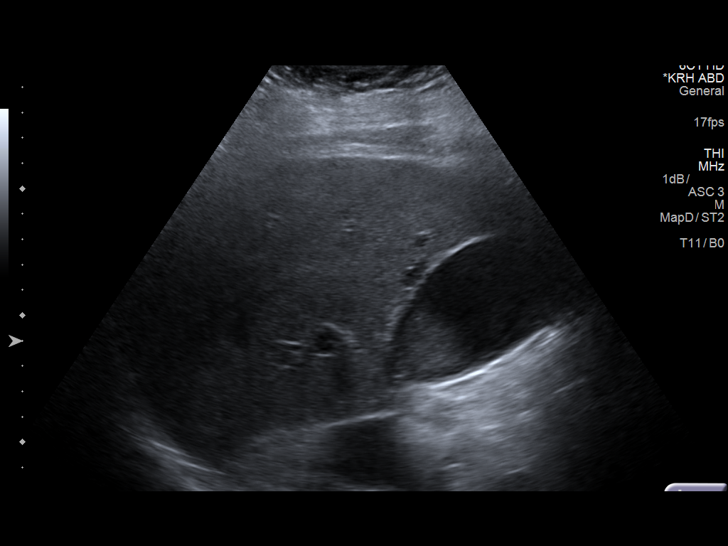
[im 50/55]
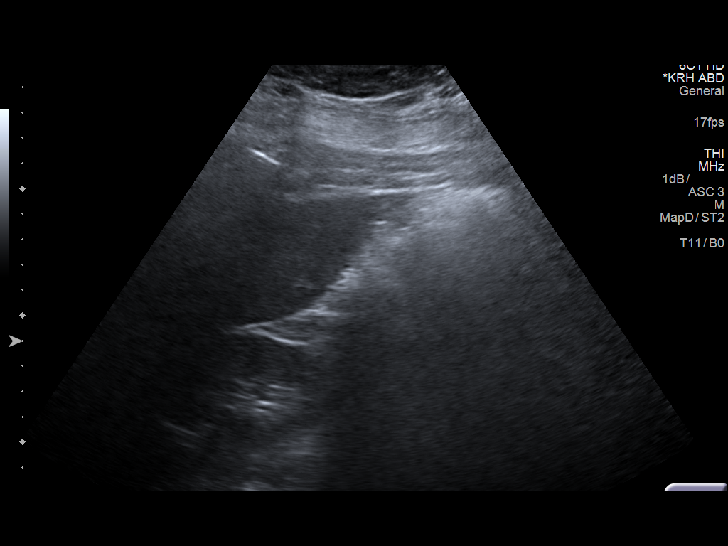
[im 55/55]
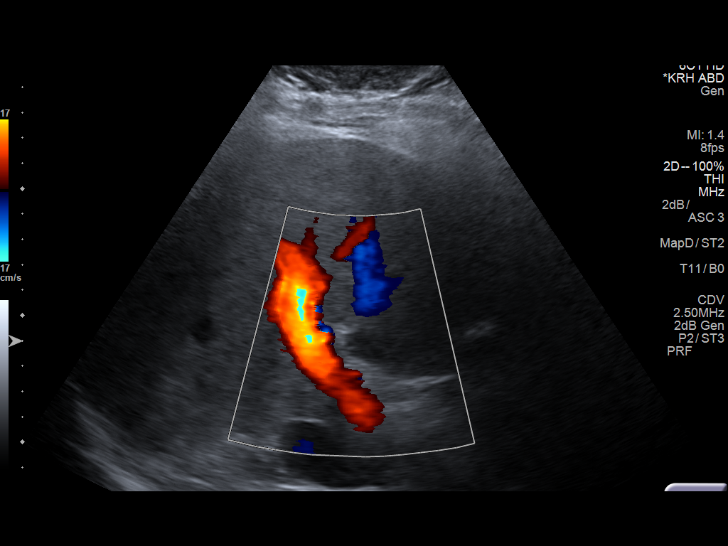

[14 of 25 positions shown; findings below may reference images not displayed]

FINDINGS: Gallbladder:

Gallbladder is well distended with a considerable amount of
dependent sludge. The stone is noted in the fundus of the
gallbladder as well. No wall thickening or pericholecystic fluid is
noted.

Common bile duct:

Diameter: 4.3 mm.

Liver:

No focal lesion identified. Within normal limits in parenchymal
echogenicity.
IMPRESSION: Cholelithiasis and gallbladder sludge. No findings of acute
cholecystitis are noted.

## 2018-03-09 IMAGING — CT CT RENAL STONE PROTOCOL
2 of 4 series · 16 of 46 positions shown, 18 images · non-contrast
Comparison: 07/12/2012

CLINICAL DATA: Epigastric pain for 1 day.

EXAM:
CT ABDOMEN AND PELVIS WITHOUT CONTRAST
TECHNIQUE: Multidetector CT imaging of the abdomen and pelvis was performed
following the standard protocol without IV contrast.

[Series 2: axial st · axial · 0.98mm/px · z∈[+912,+1377]mm · 13 of 103 slices shown, 15 images]
[im 5/103  soft-tissue]
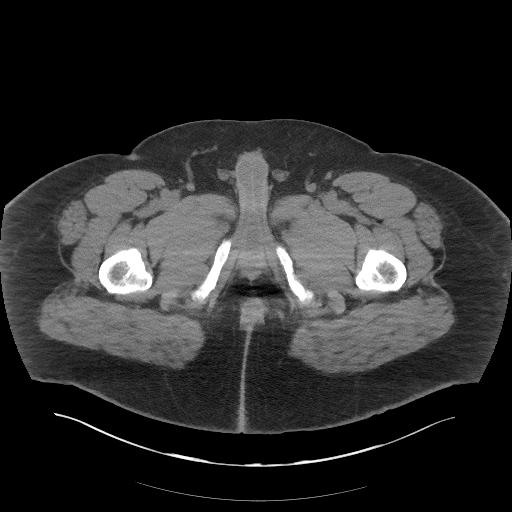
[im 5/103  bone]
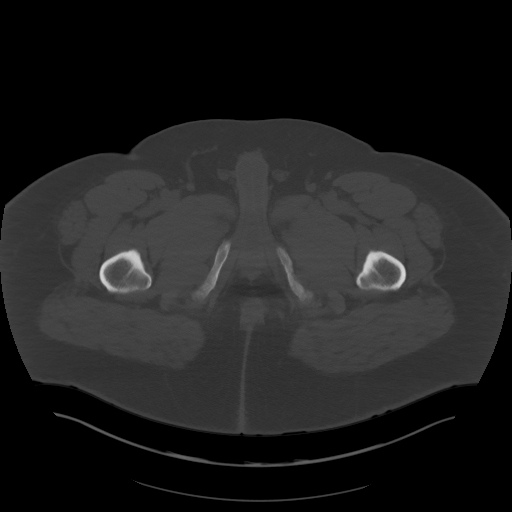
[im 14/103  soft-tissue]
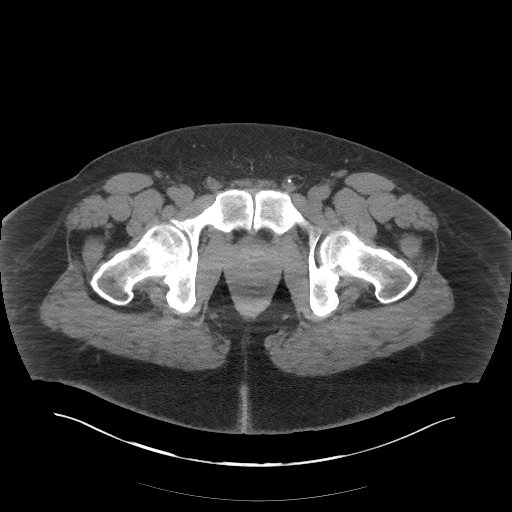
[im 23/103  soft-tissue]
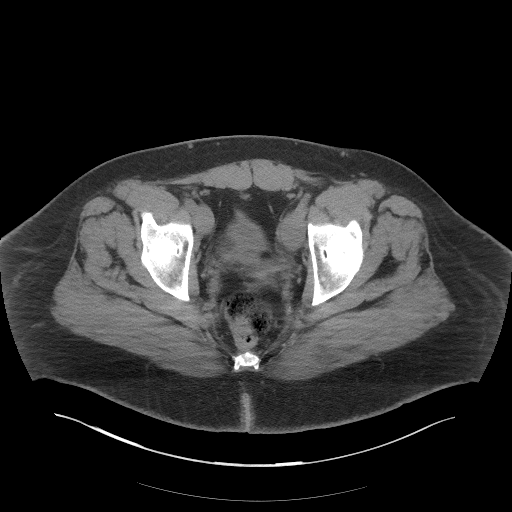
[im 27/103  soft-tissue]
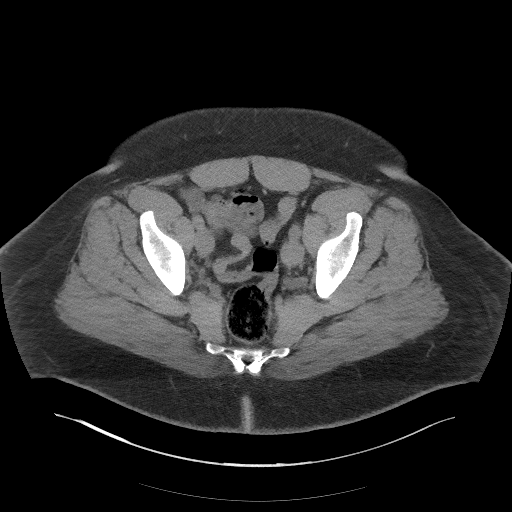
[im 36/103  soft-tissue]
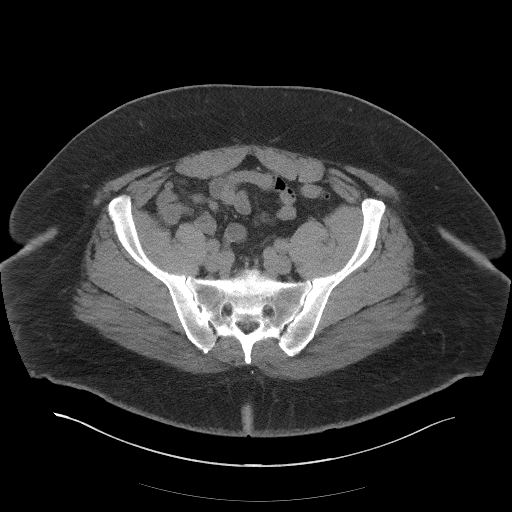
[im 45/103  soft-tissue]
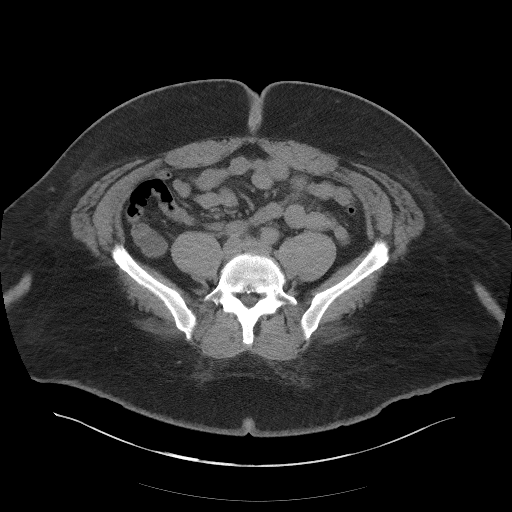
[im 54/103  soft-tissue]
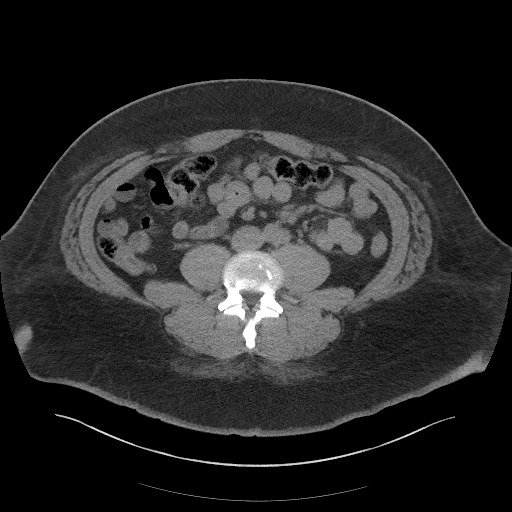
[im 58/103  soft-tissue]
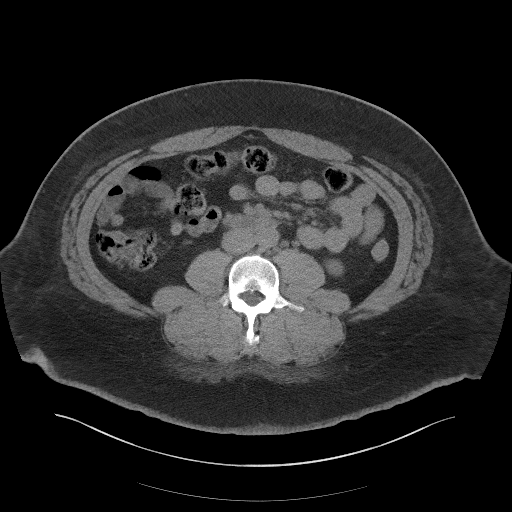
[im 67/103  soft-tissue]
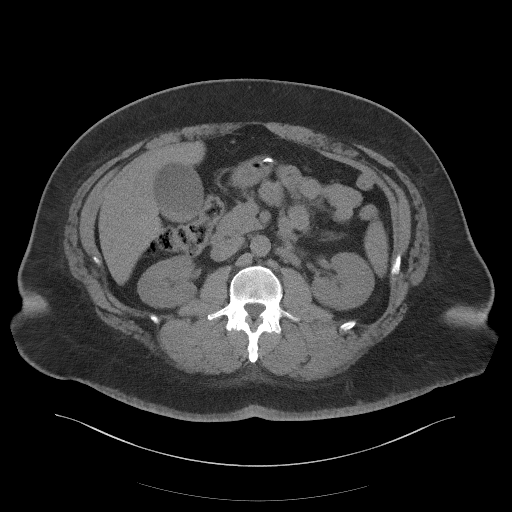
[im 67/103  bone]
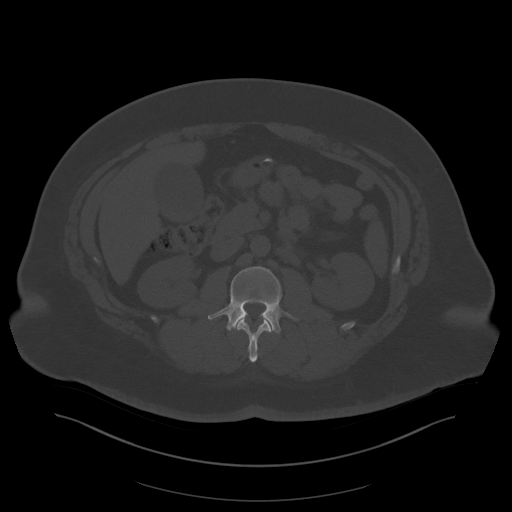
[im 76/103  soft-tissue]
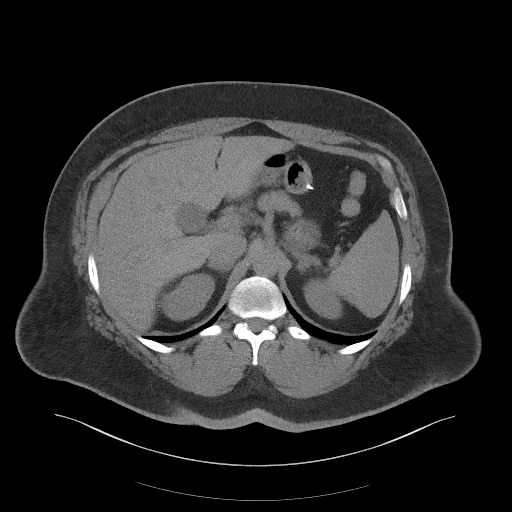
[im 80/103  soft-tissue]
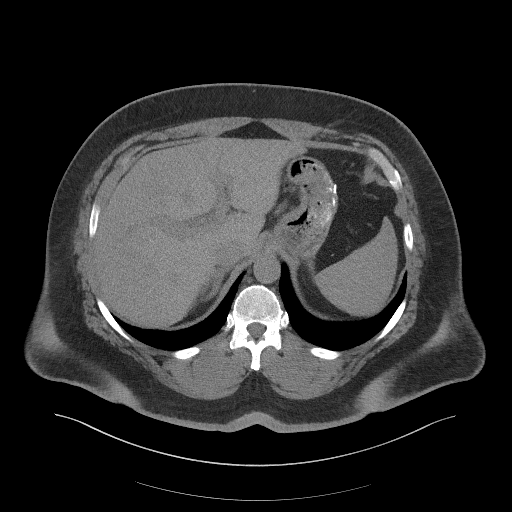
[im 89/103  soft-tissue]
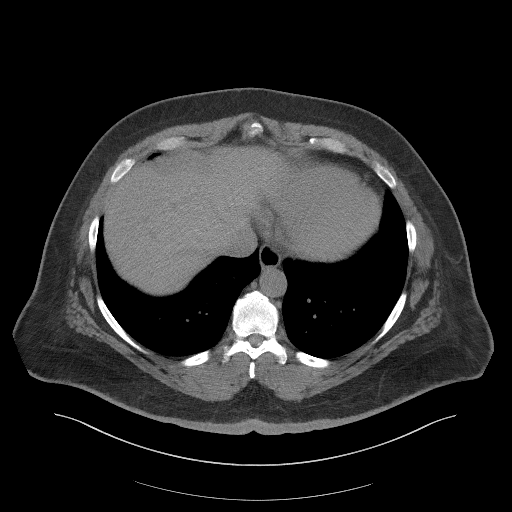
[im 98/103  soft-tissue]
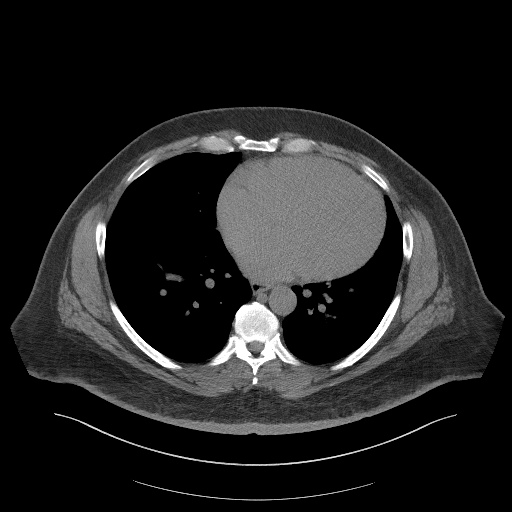

[Series 4: coronal st · coronal · 1.05mm/px · 3 of 114 slices shown]
[im 38/114  soft-tissue]
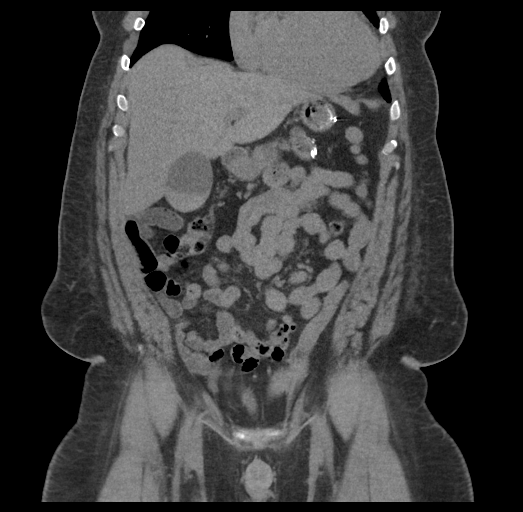
[im 51/114  soft-tissue]
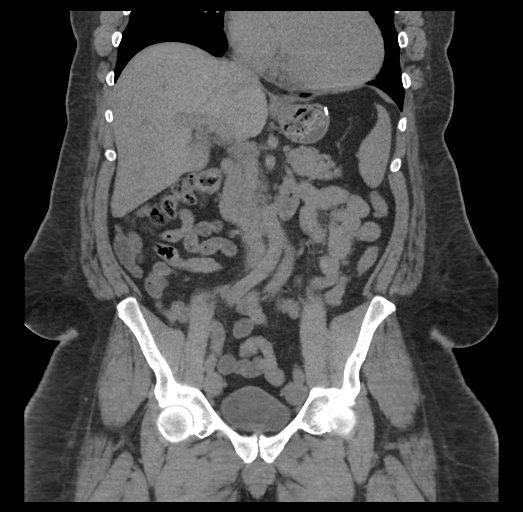
[im 63/114  soft-tissue]
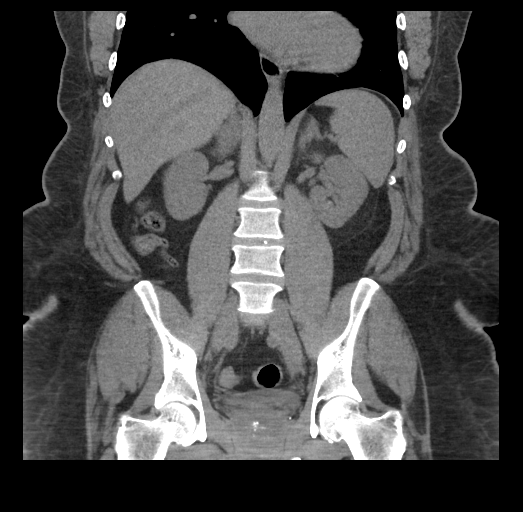

[16 of 46 positions shown; findings below may reference images not displayed]

FINDINGS: Lower chest: Mild cardiomegaly.

Hepatobiliary: Mildly distended gallbladder, diameter 5 cm, with
dependent density and also some inferior wall density including a
1.9 cm hyperdensity inferiorly in the gallbladder, these are likely
a manifestation of sludge and cholelithiasis.

Pancreas: Unremarkable

Spleen: Unremarkable

Adrenals/Urinary Tract: Left adrenal fullness with faint adjacent
stranding. No overt mass or hematoma. Right adrenal gland
unremarkable. The mild scarring in the right kidney upper pole. No
nephrolithiasis identified.

Stomach/Bowel: Prior gastric surgery, possibly a gastric sleeve.
Appendix normal. No dilated bowel. Several diverticular present at
the junction of the descending and sigmoid colon.

Vascular/Lymphatic: Mild abdominal aortic atherosclerotic
calcification.

Reproductive: Enlarged prostate gland, approximately 5.8 by 5.3 by
4.8 cm (volume = 77 cm^3), with calcifications in the central
zone.

Other: No supplemental non-categorized findings.

Musculoskeletal: Mild to moderate degenerative arthropathy of both
hips. Facet and intervertebral spurring leads to mild foraminal
stenosis at several levels most notably L3-4, L4-5, and L5-S1.
IMPRESSION: 1. Mildly distended gallbladder with suspected sludge and
gallstones. No definite pericholecystic fluid is identified.
Correlate clinically in assessing whether further workup for
cholecystitis is warranted.
2. Postoperative findings along the stomach, appearance favors
gastric sleeve.
3. There is some subtle fullness of the left adrenal gland. Some of
this was present previously. Questionable stranding around the left
adrenal gland but no overt hematoma or mass.
4. Enlarged prostate gland, 77 cubic cm.
5. Mild foraminal stenosis at L3-4, L4-5, and L5-S1 due to
spondylosis.
6. Mild cardiomegaly.
